# Patient Record
Sex: Male | Born: 1955
Health system: Southern US, Community
[De-identification: ages and names within clinical notes are randomized; demographics above are authoritative.]

## PROBLEM LIST (undated history)

## (undated) DIAGNOSIS — M1990 Unspecified osteoarthritis, unspecified site: Secondary | ICD-10-CM

## (undated) DIAGNOSIS — M109 Gout, unspecified: Secondary | ICD-10-CM

## (undated) HISTORY — DX: Unspecified osteoarthritis, unspecified site: M19.90

## (undated) HISTORY — DX: Gout, unspecified: M10.9

---

## 2002-10-19 ENCOUNTER — Emergency Department (HOSPITAL_COMMUNITY): Admission: EM | Admit: 2002-10-19 | Discharge: 2002-10-19 | Payer: Self-pay | Admitting: Emergency Medicine

## 2002-10-19 ENCOUNTER — Encounter: Payer: Self-pay | Admitting: Emergency Medicine

## 2002-12-21 ENCOUNTER — Encounter: Payer: Self-pay | Admitting: *Deleted

## 2002-12-21 ENCOUNTER — Encounter: Admission: RE | Admit: 2002-12-21 | Discharge: 2002-12-21 | Payer: Self-pay | Admitting: *Deleted

## 2003-01-06 ENCOUNTER — Encounter: Payer: Self-pay | Admitting: *Deleted

## 2003-01-06 ENCOUNTER — Encounter: Admission: RE | Admit: 2003-01-06 | Discharge: 2003-01-06 | Payer: Self-pay | Admitting: *Deleted

## 2006-04-05 ENCOUNTER — Emergency Department (HOSPITAL_COMMUNITY): Admission: EM | Admit: 2006-04-05 | Discharge: 2006-04-06 | Payer: Self-pay | Admitting: Emergency Medicine

## 2006-11-24 ENCOUNTER — Ambulatory Visit (HOSPITAL_BASED_OUTPATIENT_CLINIC_OR_DEPARTMENT_OTHER): Admission: RE | Admit: 2006-11-24 | Discharge: 2006-11-24 | Payer: Self-pay | Admitting: Orthopedic Surgery

## 2007-08-27 ENCOUNTER — Ambulatory Visit (HOSPITAL_COMMUNITY): Admission: RE | Admit: 2007-08-27 | Discharge: 2007-08-27 | Payer: Self-pay | Admitting: Orthopedic Surgery

## 2007-10-09 ENCOUNTER — Ambulatory Visit (HOSPITAL_COMMUNITY): Admission: RE | Admit: 2007-10-09 | Discharge: 2007-10-09 | Payer: Self-pay | Admitting: Gastroenterology

## 2009-10-23 ENCOUNTER — Encounter: Admission: RE | Admit: 2009-10-23 | Discharge: 2009-10-23 | Payer: Self-pay | Admitting: Internal Medicine

## 2011-02-01 NOTE — Op Note (Signed)
NAMEJASMINE, MACEACHERN             ACCOUNT NO.:  [id number]   MEDICAL RECORD NO.:  [id number]          PATIENT TYPE:  AMB   LOCATION:  DSC                          FACILITY:  [facility]   PHYSICIAN:  Feliberto Gottron. Turner Daniels, M.D.   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   PROCEDURE:  Right knee arthroscopic partial medial and lateral  meniscectomies, debridement chondromalacia from the apex of the patella.  The trochlea grade 3 with flap tears medial lateral femoral condyle  grade 3 with flap tears and removal of cartilaginous loose bodies.   SURGEON:  Feliberto Gottron. Turner Daniels, M.D.   ASSISTANTLaural Benes. Jannet Mantis.   ANESTHETIC:  General LMA.   ESTIMATED BLOOD LOSS:  Minimal.   FLUID REPLACEMENT:  7 mL crystalloid.   DRAINS PLACED:  None.   TOURNIQUET TIME:  None.   INDICATIONS FOR PROCEDURE:  This is a 55 year old man who is followed  for many months with chronic catching, popping and pain in both knees.  Plain radiographs were consistent with CPPD disease or calcium  pyrophosphate deposition in the articular and meniscal cartilages.  At  one point, he wanted both knees scoped after failing conservative  treatment with cortisone injections, anti-inflammatory medicines and  physical therapy.  Now he just wants to have one knee done, and then  wait and do the other one later.  Risks and benefits of surgery have  been discussed, questions answered.  He is very tender along the medial  joint line and desires elective relief of his pain.   DESCRIPTION OF PROCEDURE:  The patient identified by armband, taken to  the operating room at [facility].  Appropriate anesthetic  monitors were attached and general LMA anesthesia induced with  the  patient in the supine position.  Lateral post applied to the table and  the right lower extremity prepped and draped in the usual sterile  fashion from the ankle to the mid thigh using a #11 blade.  Standard  inferomedial and inferolateral peripatellar portals were then made  allowing introduction of the arthroscope through the inferolateral  portal and the outflow through the inferomedial portal.  Diagnostic  arthroscopy revealed grade 3 chondromalacia with flap tears and calcium  deposition to the apex of the patella.  The trochlea, medial and lateral  femoral condyles, and less so to the tibial plateaus, all these areas  were debrided back to stable margins with a 3.5 Gator sucker shaver, as  were fairly impressive radial and complex transverse tears through the  medial and lateral menisci.  The gutters were cleared medially and  laterally.  Small articular cartilage loose bodies were also removed  with a sucker shaver.  The cruciate  ligaments were probed and found to be intact.  The knee was irrigated  out in normal saline solution.  The arthroscopic instruments  were  removed.  A dressing of Xeroform, 4x4 dressing sponges, Webril and an  Ace wrap applied.  The patient was undraped, awakened and taken to  recovery room without difficulty.      Feliberto Gottron. Turner Daniels, M.D.  Electronically Signed     FJR/MEDQ  D:  11/24/2006  T:  11/24/2006  Job:  [id number]

## 2011-04-04 ENCOUNTER — Emergency Department (HOSPITAL_COMMUNITY): Payer: 59

## 2011-04-04 ENCOUNTER — Emergency Department (HOSPITAL_COMMUNITY)
Admission: EM | Admit: 2011-04-04 | Discharge: 2011-04-04 | Disposition: A | Discharge status: Home / Self Care | Payer: 59 | Attending: Emergency Medicine | Admitting: Emergency Medicine

## 2011-04-04 DIAGNOSIS — M1710 Unilateral primary osteoarthritis, unspecified knee: Secondary | ICD-10-CM | POA: Insufficient documentation

## 2011-04-04 DIAGNOSIS — Z79899 Other long term (current) drug therapy: Secondary | ICD-10-CM | POA: Insufficient documentation

## 2011-04-04 DIAGNOSIS — Z862 Personal history of diseases of the blood and blood-forming organs and certain disorders involving the immune mechanism: Secondary | ICD-10-CM | POA: Insufficient documentation

## 2011-04-04 DIAGNOSIS — M25469 Effusion, unspecified knee: Secondary | ICD-10-CM | POA: Insufficient documentation

## 2011-04-04 DIAGNOSIS — Z8639 Personal history of other endocrine, nutritional and metabolic disease: Secondary | ICD-10-CM | POA: Insufficient documentation

## 2011-04-04 DIAGNOSIS — M25569 Pain in unspecified knee: Secondary | ICD-10-CM | POA: Insufficient documentation

## 2012-09-02 ENCOUNTER — Ambulatory Visit (INDEPENDENT_AMBULATORY_CARE_PROVIDER_SITE_OTHER): Payer: Commercial Managed Care - PPO | Admitting: Emergency Medicine

## 2012-09-02 VITALS — BP 112/78 | HR 74 | Temp 98.3°F | Resp 16 | Ht 67.75 in | Wt 193.6 lb

## 2012-09-02 DIAGNOSIS — A5903 Trichomonal cystitis and urethritis: Secondary | ICD-10-CM

## 2012-09-02 LAB — POCT URINALYSIS DIPSTICK
Bilirubin, UA: NEGATIVE
Blood, UA: NEGATIVE
Leukocytes, UA: NEGATIVE
Nitrite, UA: NEGATIVE
Protein, UA: NEGATIVE
pH, UA: 5.5

## 2012-09-02 LAB — POCT UA - MICROSCOPIC ONLY: Bacteria, U Microscopic: NEGATIVE

## 2012-09-02 MED ORDER — METRONIDAZOLE 500 MG PO TABS: 500.0000 mg | ORAL_TABLET | Freq: Two times a day (BID) | ORAL | Status: DC

## 2012-09-02 NOTE — Patient Instructions (Signed)
Trichomoniasis  Trichomoniasis is an infection, caused by the Trichomonas organism, that affects both women and men. In women, the outer male genitalia and the vagina are affected. In men, the penis is mainly affected, but the prostate and other reproductive organs can also be involved. Trichomoniasis is a sexually transmitted disease (STD) and is most often passed to another person through sexual contact. The majority of people who get trichomoniasis, do so from a sexual encounter and are also at risk for other STDs, including gonorrhea and chlamydia, hepatitis B, and HIV. Trichomoniasis can increase the risk for HIV and pregnancy problems.  SYMPTOMS   · Abnormal gray-green, frothy vaginal discharge in women.  · Itching and irritation of the area outside the vagina in women.  · Penile discharge with or without pain in males.  · Inflammation of the urethra (urethritis), causing painful urination.  · Bleeding after sexual intercourse.  HOME CARE INSTRUCTIONS   · Take all medication prescribed by your caregiver.  · Take over-the-counter medication for itching or irritation as directed by your caregiver.  · Do not have sexual intercourse while you have the infection.  · Do not douche or wear tampons while you have the infection.  · Discuss the infection with your partner, as your partner may have acquired the infection from you. Or, your partner may have been the person who transmitted the infection to you.  · Have your sex partner examined and treated if necessary.  · Practice safe, informed, and protected sex.  · See your caregiver for other STD testing.  SEEK MEDICAL CARE IF:  · You still have symptoms after you finish the medication.  · You have an oral temperature above 102° F (38.9° C).  · You develop belly (abdominal) pain.  · You have pain when you urinate.  · You have bleeding after sexual intercourse.  · You develop a rash.  · The medication makes you sick or throw up (vomit).  Document Released: 10/10/2004  Document Revised: 11/25/2011 Document Reviewed: 03/24/2009  ExitCare® Patient Information ©2013 [employer].

## 2012-09-02 NOTE — Progress Notes (Signed)
[facility] [address], [place] [address] [phone]  Date:  09/02/2012   Name:  John Bernard   DOB:  August 01, 1956   MRN:  [id number]  PCP:  No primary provider on file.    Chief Complaint: Exposure to STD   History of Present Illness:  John Bernard is a 56 y.o. very pleasant male patient who presents with the following:  Says that his sexual partner was treated for trichomonas.  He has no discharge or dysuria, urgency, or frequency.  Treated 10-15 years ago for STD but doesn't remember nature of infection.  There is no problem list on file for this patient.   Past Medical History  Diagnosis Date  . Gout   . Arthritis     History reviewed. No pertinent past surgical history.  History  Substance Use Topics  . Smoking status: Current Every Day Smoker  . Smokeless tobacco: Not on file  . Alcohol Use: Not on file    Family History  Problem Relation Age of Onset  . Cancer Mother   . Cancer Father     No Known Allergies  Medication list has been reviewed and updated.  No current outpatient prescriptions on file prior to visit.    Review of Systems:  As per HPI, otherwise negative.    Physical Examination: Filed Vitals:   09/02/12 1243  BP: 112/78  Pulse: 74  Temp: 98.3 F (36.8 C)  Resp: 16   Filed Vitals:   09/02/12 1243  Height: 5' 7.75" (1.721 m)  Weight: 193 lb 9.6 oz (87.816 kg)   Body mass index is 29.65 kg/(m^2). Ideal Body Weight: Weight in (lb) to have BMI = 25: 162.9    GEN: WDWN, NAD, Non-toxic, Alert & Oriented x 3 HEENT: Atraumatic, Normocephalic.  Ears and Nose: No external deformity. EXTR: No clubbing/cyanosis/edema NEURO: Normal gait.  PSYCH: Normally interactive. Conversant. Not depressed or anxious appearing.  Calm demeanor.  Genitalia: normal male no discharge  Assessment and Plan: Exposure to trichomonas Flagyl  Labs    UA    Glenford Peers probe  [name], MD  Results for  orders placed in visit on 09/02/12  POCT UA - MICROSCOPIC ONLY      Component Value Range   WBC, Ur, HPF, POC 0-1     RBC, urine, microscopic 0-1     Bacteria, U Microscopic neg     Mucus, UA neg     Epithelial cells, urine per micros 0-2     Crystals, Ur, HPF, POC neg     Casts, Ur, LPF, POC neg     Yeast, UA neg    POCT URINALYSIS DIPSTICK      Component Value Range   Color, UA yellow     Clarity, UA clear     Glucose, UA neg     Bilirubin, UA neg     Ketones, UA neg     Spec Grav, UA >=1.030     Blood, UA neg     pH, UA 5.5     Protein, UA neg     Urobilinogen, UA 0.2     Nitrite, UA neg     Leukocytes, UA Negative

## 2012-09-11 NOTE — Progress Notes (Signed)
Reviewed and agree.

## 2015-10-13 MED FILL — DENTA 5000 PLUS CREAM: 1.1 | 25 days supply | Qty: 51 | Fill #0

## 2015-10-17 MED FILL — ALLOPURINOL 300 MG TABLET: 300 | 30 days supply | Qty: 30 | Fill #0

## 2015-10-17 MED FILL — COLCHICINE 0.6 MG TABLET: 0.6 | 30 days supply | Qty: 30 | Fill #0

## 2015-10-31 ENCOUNTER — Encounter (HOSPITAL_COMMUNITY): Payer: Self-pay

## 2015-10-31 ENCOUNTER — Emergency Department (HOSPITAL_COMMUNITY)
Admission: EM | Admit: 2015-10-31 | Discharge: 2015-10-31 | Disposition: A | Discharge status: Home / Self Care | Payer: 59 | Attending: Emergency Medicine | Admitting: Emergency Medicine

## 2015-10-31 ENCOUNTER — Emergency Department (HOSPITAL_COMMUNITY): Payer: 59

## 2015-10-31 DIAGNOSIS — F17200 Nicotine dependence, unspecified, uncomplicated: Secondary | ICD-10-CM | POA: Insufficient documentation

## 2015-10-31 DIAGNOSIS — S6992XA Unspecified injury of left wrist, hand and finger(s), initial encounter: Secondary | ICD-10-CM | POA: Diagnosis present

## 2015-10-31 DIAGNOSIS — S8002XA Contusion of left knee, initial encounter: Secondary | ICD-10-CM | POA: Insufficient documentation

## 2015-10-31 DIAGNOSIS — Y92410 Unspecified street and highway as the place of occurrence of the external cause: Secondary | ICD-10-CM | POA: Insufficient documentation

## 2015-10-31 DIAGNOSIS — S79912A Unspecified injury of left hip, initial encounter: Secondary | ICD-10-CM | POA: Insufficient documentation

## 2015-10-31 DIAGNOSIS — Z792 Long term (current) use of antibiotics: Secondary | ICD-10-CM | POA: Insufficient documentation

## 2015-10-31 DIAGNOSIS — Y998 Other external cause status: Secondary | ICD-10-CM | POA: Insufficient documentation

## 2015-10-31 DIAGNOSIS — M25552 Pain in left hip: Secondary | ICD-10-CM

## 2015-10-31 DIAGNOSIS — Y9389 Activity, other specified: Secondary | ICD-10-CM | POA: Insufficient documentation

## 2015-10-31 DIAGNOSIS — Z8739 Personal history of other diseases of the musculoskeletal system and connective tissue: Secondary | ICD-10-CM | POA: Insufficient documentation

## 2015-10-31 DIAGNOSIS — S60222A Contusion of left hand, initial encounter: Secondary | ICD-10-CM | POA: Diagnosis not present

## 2015-10-31 MED ORDER — NAPROXEN 500 MG PO TABS: 500.0000 mg | ORAL_TABLET | Freq: Two times a day (BID) | ORAL | Status: DC

## 2015-10-31 MED ORDER — HYDROCODONE-ACETAMINOPHEN 5-325 MG PO TABS
1.0000 | ORAL_TABLET | Freq: Once | ORAL | Status: AC
  Administered 2015-10-31: 1 via ORAL
  Filled 2015-10-31: qty 1

## 2015-10-31 MED ORDER — HYDROCODONE-ACETAMINOPHEN 5-325 MG PO TABS: 1.0000 | ORAL_TABLET | Freq: Four times a day (QID) | ORAL | Status: DC | PRN

## 2015-10-31 MED ORDER — ONDANSETRON 4 MG PO TBDP
4.0000 mg | ORAL_TABLET | Freq: Once | ORAL | Status: AC
  Administered 2015-10-31: 4 mg via ORAL
  Filled 2015-10-31: qty 1

## 2015-10-31 NOTE — Discharge Instructions (Signed)
Your x-rays showed no evidence of fracture or dislocation. I will give you prescriptions for Vicodin and naproxen.  Take medications as prescribed. Return to the emergency room for worsening condition or new concerning symptoms. Follow up with your regular doctor. If you don't have a regular doctor use one of the numbers below to establish a primary care doctor.   Emergency Department Resource Guide 1) Find a Doctor and Pay Out of Pocket Although you won't have to find out who is covered by your insurance plan, it is a good idea to ask around and get recommendations. You will then need to call the office and see if the doctor you have chosen will accept you as a new patient and what types of options they offer for patients who are self-pay. Some doctors offer discounts or will set up payment plans for their patients who do not have insurance, but you will need to ask so you aren't surprised when you get to your appointment.  2) Contact Your Local Health Department Not all health departments have doctors that can see patients for sick visits, but many do, so it is worth a call to see if yours does. If you don't know where your local health department is, you can check in your phone book. The CDC also has a tool to help you locate your state's health department, and many state websites also have listings of all of their local health departments.  3) Find a Walk-in Clinic If your illness is not likely to be very severe or complicated, you may want to try a walk in clinic. These are popping up all over the country in pharmacies, drugstores, and shopping centers. They're usually staffed by nurse practitioners or physician assistants that have been trained to treat common illnesses and complaints. They're usually fairly quick and inexpensive. However, if you have serious medical issues or chronic medical problems, these are probably not your best option.  No Primary Care Doctor: - Call Health Connect at   [phone] - they can help you locate a primary care doctor that  accepts your insurance, provides certain services, etc. - Physician Referral [phone]  Emergency Department Resource Guide 1) Find a Doctor and Pay Out of Pocket Although you won't have to find out who is covered by your insurance plan, it is a good idea to ask around and get recommendations. You will then need to call the office and see if the doctor you have chosen will accept you as a new patient and what types of options they offer for patients who are self-pay. Some doctors offer discounts or will set up payment plans for their patients who do not have insurance, but you will need to ask so you aren't surprised when you get to your appointment.  2) Contact Your Local Health Department Not all health departments have doctors that can see patients for sick visits, but many do, so it is worth a call to see if yours does. If you don't know where your local health department is, you can check in your phone book. The CDC also has a tool to help you locate your state's health department, and many state websites also have listings of all of their local health departments.  3) Find a Walk-in Clinic If your illness is not likely to be very severe or complicated, you may want to try a walk in clinic. These are popping up all over the country in pharmacies, drugstores, and shopping centers. They're usually staffed  by nurse practitioners or physician assistants that have been trained to treat common illnesses and complaints. They're usually fairly quick and inexpensive. However, if you have serious medical issues or chronic medical problems, these are probably not your best option.  No Primary Care Doctor: - Call Health Connect at  [phone] - they can help you locate a primary care doctor that  accepts your insurance, provides certain services, etc. - Physician Referral Service- [phone]  Chronic Pain  Problems: Organization         Address  Phone   Notes  [facility]  [phone] Patients need to be referred by their primary care doctor.   Medication Assistance: Organization         Address  Phone   Notes  [facility] [address] [address] [facility] [address] [place], [place] [address] [phone] --Must be a resident of [facility] [place] [facility] -- Must have NO insurance coverage whatsoever (no Medicaid/ Medicare, etc.) -- The pt. MUST have a primary care doctor that directs their care regularly and follows them in the community   Med[facility]ist  [phone]   [employer]  [phone]    Agencies that provide inexpensive medical care: Organization         Address  Phone   Notes  [facility]  [phone]   [facility]    [phone]   [facility] [address] [place], [place] [address] [phone]   [facility] [address], [place] [phone]   [facility]    [phone]   [facility]    [phone]   [facility]  [address], [place] Phone:  [phone], Fax:  [phone] Hours of Operation:  9 am - 6 pm, M-F.  Also accepts Medicaid/Medicare and self-pay.  [facility]  [address], [place] Phone: [phone], Fax: [phone]. Hours of Operation:  8:30 am - 5:30 pm, M-F.  Also accepts Medicaid and self-pay.  [facility] [place] [address], [place] Phone: [phone]   [facility] [address], [place], [place] [phone], Ext. [phone] Mondays & Thursdays: 7-9 AM.  First 15 patients are seen on a first come, first serve basis.    Medicaid-accepting [facility] Providers:  Organization         Address  Phone   Notes  [facility] [address], [place] [phone] Also  accepts self-pay patients.  [facility] [address], [place]  [phone]   [facility] [address], [place] [phone]   [facility] [address] [place] [address], [place] [phone]   Renaye RakersVeita Bland [address], [place]   [phone] Only accepts [place] patients after they have their name applied to their card.   Self-Pay (no insurance) in [facility] [place]:  Organization         Address  Phone   Notes  Sickle Cell Patients, [facility] [address], [place] [phone]   [facility] [address], [place] [phone]   [facility] [place]  [address], [place] [phone]   [facility]/Dr.  Osei-Bonsu  [address] [place] or [address], [place] [phone] Phone number for both [place] and [place] locations is the same.  [facility] [address], [place] [phone]   [facility] [address] [place] or [address] [phone]   [facility] [address] [place] [phone], phone; [phone], fax Sees patients 1st and 3rd Saturday of every month.  Must not qualify for public or private insurance (i.e. Medicaid, Medicare, [place] Health Choice, Veterans' Benefits)  Household income should be no more than 200% of the poverty level The clinic cannot treat you if you are pregnant or think you are pregnant  Sexually transmitted diseases are not treated at the clinic.     Contusion A contusion is a deep bruise. Contusions are the result of a blunt injury to tissues and muscle fibers under the skin. The injury causes bleeding under the skin. The skin overlying the contusion may turn blue, purple, or yellow. Minor injuries will  give you a painless contusion, but more severe contusions may stay painful and swollen for a few weeks.  CAUSES  This condition is usually caused by a blow, trauma, or direct force to an area of the body. SYMPTOMS  Symptoms of this condition include:  Swelling of the injured area.  Pain and tenderness in the injured area.  Discoloration. The area may have redness and then turn blue, purple, or yellow. DIAGNOSIS  This condition is diagnosed based on a physical exam and medical history. An X-ray, CT scan, or MRI may be needed to determine if there are any associated injuries, such as broken bones (fractures). TREATMENT  Specific treatment for this condition depends on what area of the body was injured. In general, the best treatment for a contusion is resting, icing, applying pressure to (compression), and elevating the injured area. This is often called the RICE strategy. Over-the-counter anti-inflammatory medicines may also be recommended for pain control.  HOME CARE INSTRUCTIONS   Rest the injured area.  If directed, apply ice to the injured area:  Put ice in a plastic bag.  Place a towel between your skin and the bag.  Leave the ice on for 20 minutes, 2-3 times per day.  If directed, apply light compression to the injured area using an elastic bandage. Make sure the bandage is not wrapped too tightly. Remove and reapply the bandage as directed by your health care provider.  If possible, raise (elevate) the injured area above the level of your heart while you are sitting or lying down.  Take over-the-counter and prescription medicines only as told by your health care provider. SEEK MEDICAL CARE IF:  Your symptoms do not improve after several days of treatment.  Your symptoms get worse.  You have difficulty moving the injured area. SEEK IMMEDIATE MEDICAL CARE IF:   You have severe pain.  You have numbness in a hand or foot.  Your hand or foot turns pale or cold.   This  information is not intended to replace advice given to you by your health care provider. Make sure you discuss any questions you have with your health care provider.   Document Released: 06/12/2005 Document Revised: 05/24/2015 Document Reviewed: 01/18/2015 Elsevier Interactive Patient Education [employer]
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
## 2015-11-01 MED FILL — NAPROXEN 500 MG TABLET: 500 | 15 days supply | Qty: 30 | Fill #0

## 2015-11-01 MED FILL — HYDROCODON-APAP 5-325: 5-325 | 3 days supply | Qty: 10 | Fill #0

## 2015-12-27 ENCOUNTER — Other Ambulatory Visit: Payer: Self-pay | Admitting: Internal Medicine

## 2015-12-27 DIAGNOSIS — R109 Unspecified abdominal pain: Secondary | ICD-10-CM

## 2016-01-22 ENCOUNTER — Ambulatory Visit
Admission: RE | Admit: 2016-01-22 | Discharge: 2016-01-22 | Disposition: A | Discharge status: Home / Self Care | Payer: BLUE CROSS/BLUE SHIELD | Source: Ambulatory Visit | Attending: Internal Medicine | Admitting: Internal Medicine

## 2016-01-22 DIAGNOSIS — R109 Unspecified abdominal pain: Secondary | ICD-10-CM

## 2016-02-15 MED FILL — COLCRYS 0.6 MG TABLET: 0.6 | 30 days supply | Qty: 30 | Fill #0

## 2016-05-22 MED FILL — MELOXICAM 7.5 MG TABLET: 7.5 | 30 days supply | Qty: 30 | Fill #0

## 2016-05-22 MED FILL — predniSONE 10 MG TABS: 10 | 6 days supply | Qty: 21 | Fill #0

## 2016-06-12 MED FILL — COLCRYS 0.6 MG TABLET: 0.6 | 90 days supply | Qty: 90 | Fill #0

## 2016-06-12 MED FILL — ULORIC 40 MG TABLET: 40 | 90 days supply | Qty: 90 | Fill #0
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
## 2017-03-22 IMAGING — US US ABDOMEN COMPLETE
1 series · 14 of 25 positions shown · non-contrast
Comparison: None.

CLINICAL DATA: Abdominal pain for 9 months.

EXAM:
ABDOMEN ULTRASOUND COMPLETE

[Series 1: us abdomen complete · 0.26mm/px · 14 of 67 slices shown]
[im 1/67]
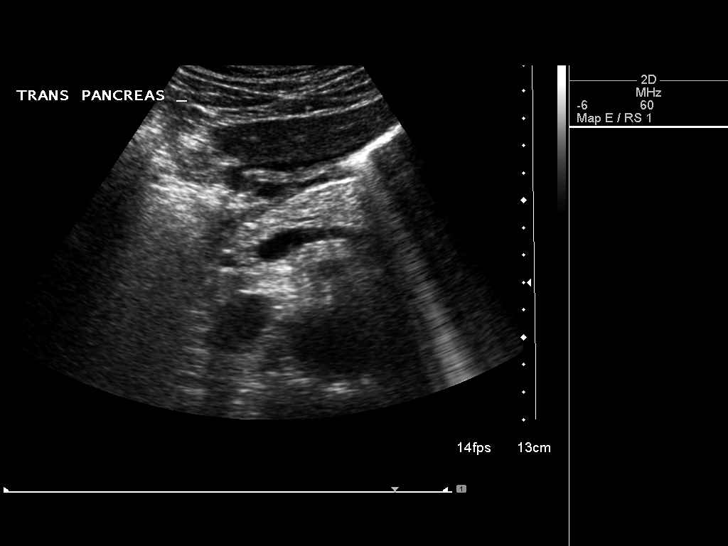
[im 6/67]
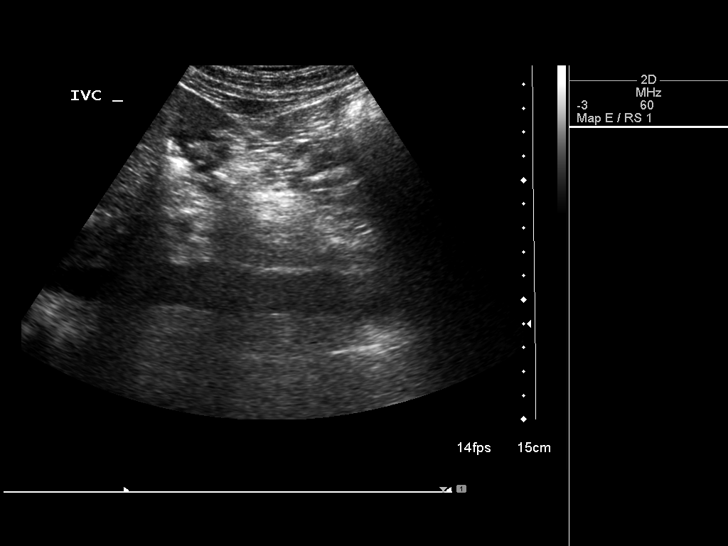
[im 12/67]
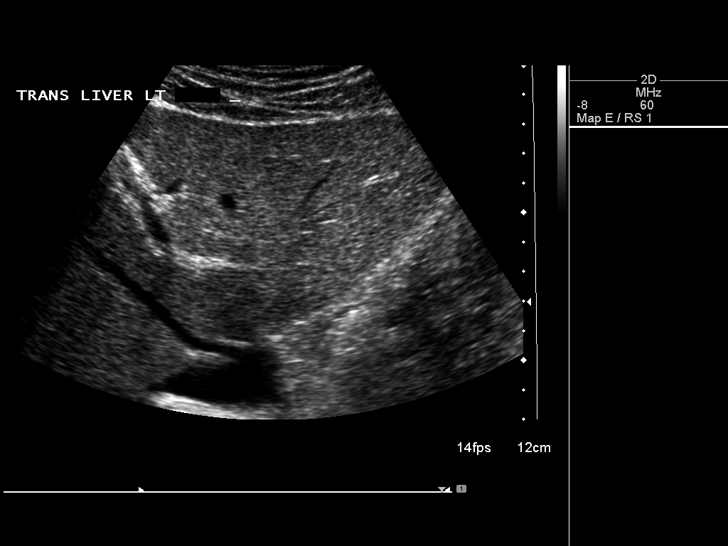
[im 17/67]
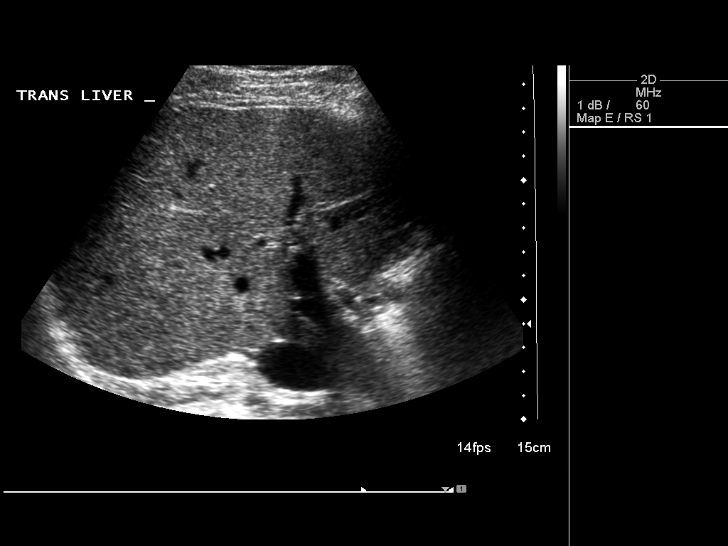
[im 23/67]
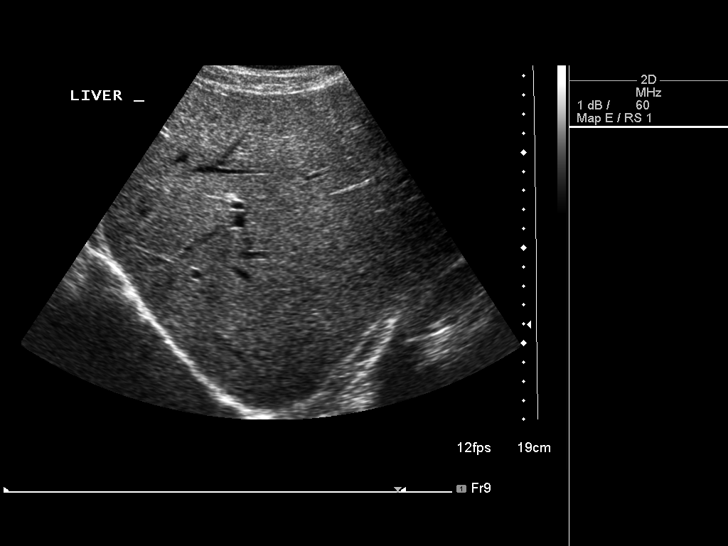
[im 25/67]
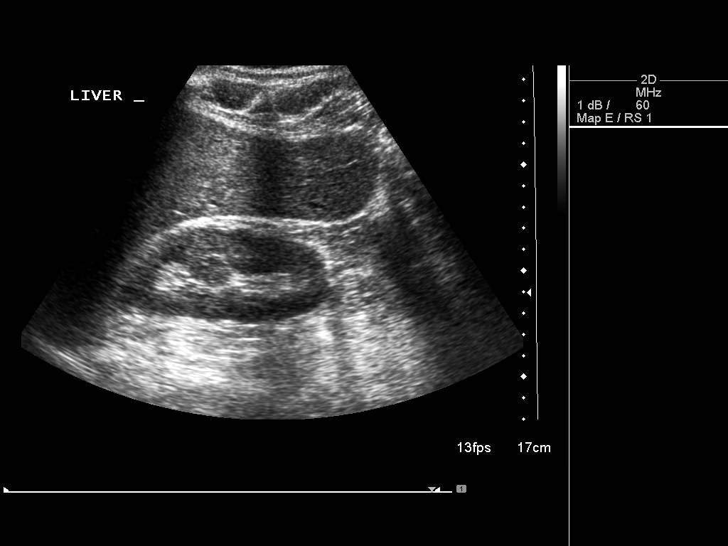
[im 31/67]
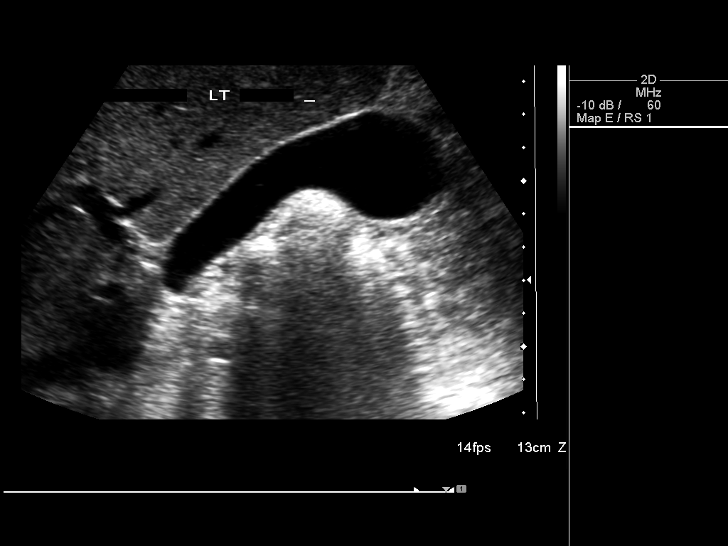
[im 36/67]
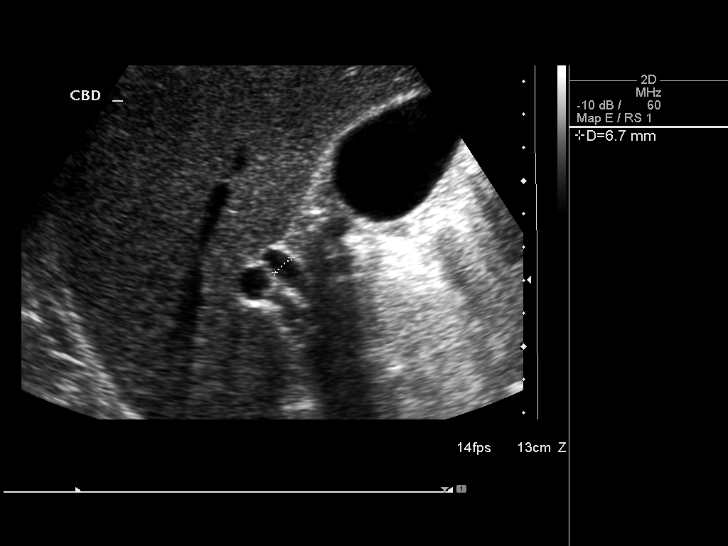
[im 42/67]
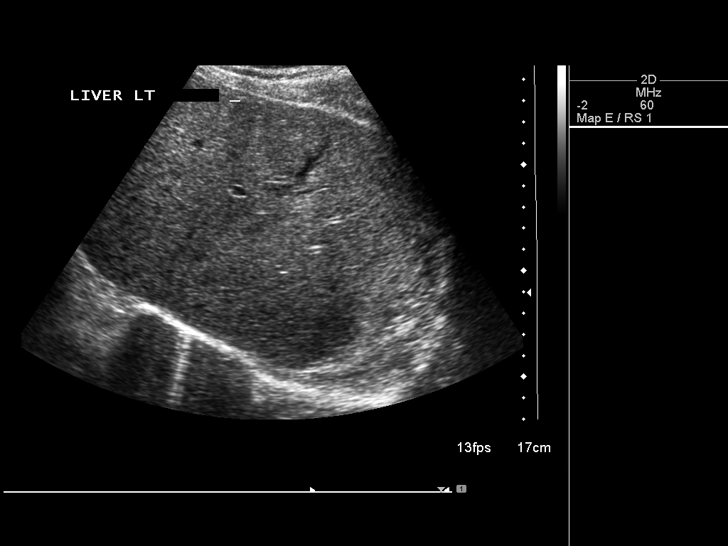
[im 45/67]
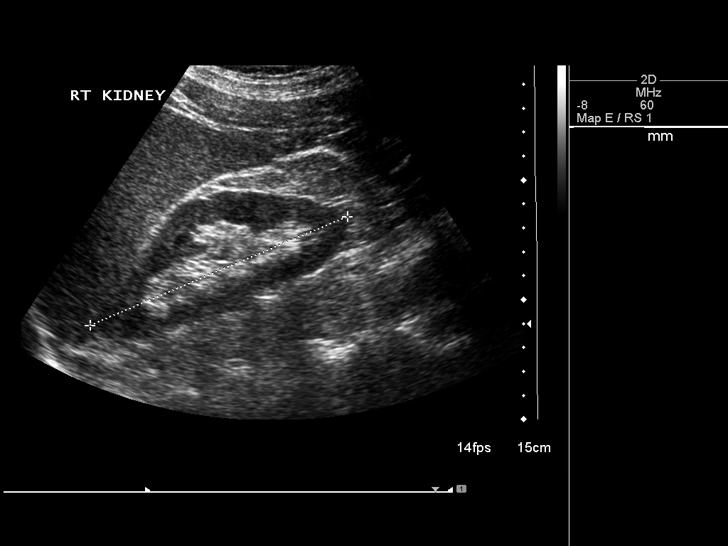
[im 50/67]
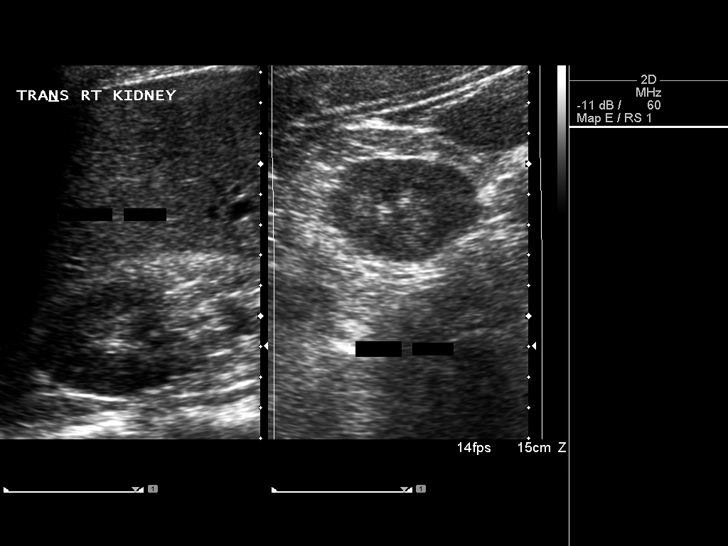
[im 56/67]
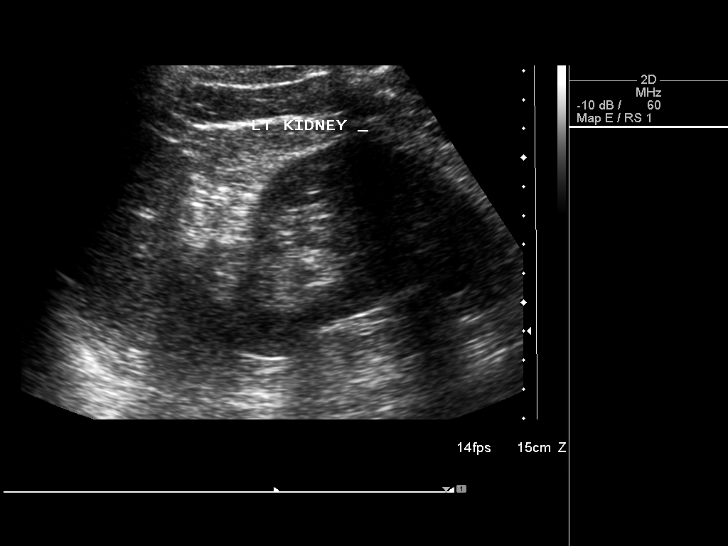
[im 61/67]
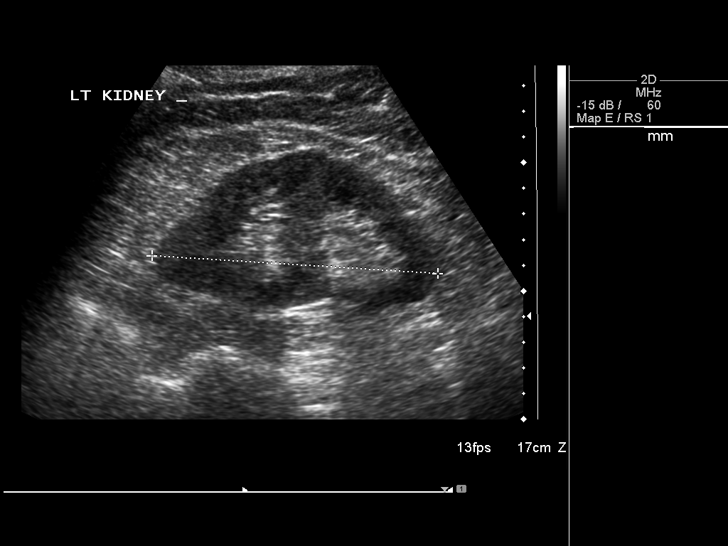
[im 67/67]
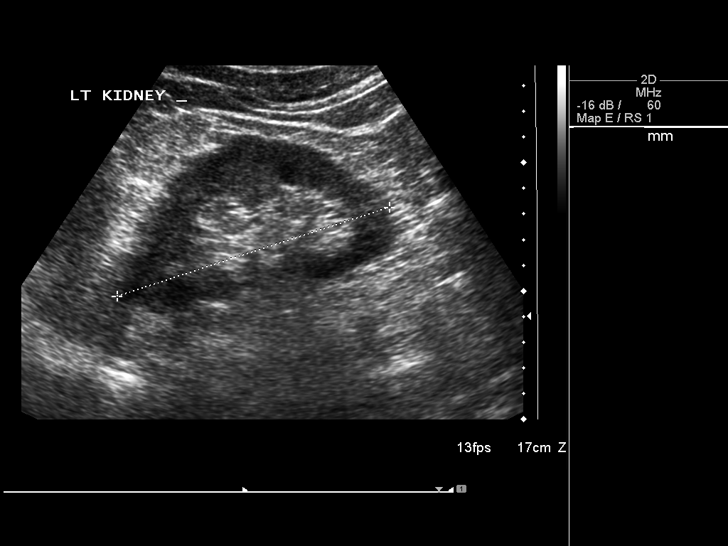

[14 of 25 positions shown; findings below may reference images not displayed]

FINDINGS: Gallbladder: No gallstones or wall thickening visualized. No
sonographic Murphy sign noted by sonographer.

Common bile duct: Diameter: 6.7 mm. This is slightly prominent.
However, no visible common bile duct stone. No intrahepatic ductal
dilatation.

Liver: No focal lesion identified. Within normal limits in
parenchymal echogenicity.

IVC: No abnormality visualized.

Pancreas: Visualized portion unremarkable.

Spleen: 4.4 cm in length.  Normal.

Right Kidney: Length: 11.7 cm. Echogenicity within normal limits. No
mass or hydronephrosis visualized.

Left Kidney: Length: 11.2 cm. Echogenicity within normal limits. No
mass or hydronephrosis visualized.

Abdominal aorta: 2.3 cm maximal diameter.  No aneurysm.

Other findings: None.
IMPRESSION: No significant abnormality. Slight prominence of the common bile
duct is nonspecific. Is the patient's bilirubin normal?

## 2017-04-07 MED FILL — COLCRYS 0.6 MG TABLET: 0.6 | 87 days supply | Qty: 90 | Fill #0

## 2017-04-07 MED FILL — predniSONE 10 MG TABS: 10 | 6 days supply | Qty: 21 | Fill #0

## 2017-04-07 MED FILL — MELOXICAM 7.5 MG TABLET: 7.5 | 90 days supply | Qty: 90 | Fill #0

## 2018-03-27 MED FILL — ALLOPURINOL 100 MG TABLET: 100 | 30 days supply | Qty: 30 | Fill #0

## 2018-03-27 MED FILL — predniSONE 10 MG TABS: 10 | 6 days supply | Qty: 21 | Fill #0

## 2018-05-01 MED FILL — GABAPENTIN 100 MG CAP: 100 | 30 days supply | Qty: 90 | Fill #0

## 2018-10-02 ENCOUNTER — Ambulatory Visit: Payer: Commercial Managed Care - PPO | Admitting: Nurse Practitioner

## 2019-01-04 ENCOUNTER — Ambulatory Visit
Admission: EM | Admit: 2019-01-04 | Discharge: 2019-01-04 | Disposition: A | Discharge status: Home / Self Care | Payer: BLUE CROSS/BLUE SHIELD | Attending: Family Medicine | Admitting: Family Medicine

## 2019-01-04 ENCOUNTER — Encounter: Payer: Self-pay | Admitting: Emergency Medicine

## 2019-01-04 ENCOUNTER — Other Ambulatory Visit: Payer: Self-pay

## 2019-01-04 DIAGNOSIS — Z202 Contact with and (suspected) exposure to infections with a predominantly sexual mode of transmission: Secondary | ICD-10-CM | POA: Diagnosis present

## 2019-01-04 MED ORDER — METRONIDAZOLE 500 MG PO TABS: 2000.0000 mg | ORAL_TABLET | Freq: Once | ORAL | 0 refills | Status: AC
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
## 2019-01-04 NOTE — ED Notes (Signed)
Patient able to ambulate independently  

## 2019-01-04 NOTE — Discharge Instructions (Addendum)
We are giving you the male treatment for trichomonas.  Test should be back by Wednesday and we will call you if positive.

## 2019-01-04 NOTE — ED Provider Notes (Signed)
[facility]    CSN: [id number] Arrival date & time: 01/04/19  1551     History   Chief Complaint Chief Complaint  Patient presents with  . Exposure to STD    HPI John Bernard is a 63 y.o. male.   New patient to [facility] presents with exposure to trichomonas.  Partner tested positive.  He has no sx.     Past Medical History:  Diagnosis Date  . Arthritis   . Gout     There are no active problems to display for this patient.   History reviewed. No pertinent surgical history.     Home Medications    Prior to Admission medications   Medication Sig Start Date End Date Taking? Authorizing Provider  allopurinol (ZYLOPRIM) 100 MG tablet Take 100 mg by mouth daily.   Yes [provider]  colchicine 0.6 MG tablet Take 0.6 mg by mouth daily.   Yes [provider]  metroNIDAZOLE (FLAGYL) 500 MG tablet Take 4 tablets (2,000 mg total) by mouth once for 1 dose. 01/04/19 01/04/19  Elvina SidleLauenstein, Alexah Kivett, MD    Family History Family History  Problem Relation Age of Onset  . Cancer Mother   . Cancer Father     Social History Social History   Tobacco Use  . Smoking status: Current Every Day Smoker    Packs/day: 0.25  . Smokeless tobacco: Never Used  Substance Use Topics  . Alcohol use: Yes    Comment: socially  . Drug use: Not on file     Allergies   Patient has no known allergies.   Review of Systems Review of Systems  Genitourinary: Negative.   All other systems reviewed and are negative.    Physical Exam Triage Vital Signs ED Triage Vitals [01/04/19 1603]  Enc Vitals Group     BP (!) 160/85     Pulse Rate 72     Resp 18     Temp 98.6 F (37 C)     Temp Source Oral     SpO2 95 %     Weight      Height      Head Circumference      Peak Flow      Pain Score 0     Pain Loc      Pain Edu?      Excl. in GC?    No data found.  Updated Vital Signs BP (!) 160/85 (BP Location: Right Arm)   Pulse 72   Temp 98.6 F (37  C) (Oral)   Resp 18   SpO2 95%    Physical Exam Vitals signs and nursing note reviewed.  Constitutional:      Appearance: Normal appearance.  HENT:     Mouth/Throat:     Mouth: Mucous membranes are moist.  Eyes:     Conjunctiva/sclera: Conjunctivae normal.  Neck:     Musculoskeletal: Normal range of motion and neck supple.  Cardiovascular:     Rate and Rhythm: Normal rate.  Pulmonary:     Effort: Pulmonary effort is normal.  Musculoskeletal: Normal range of motion.  Skin:    General: Skin is warm.  Neurological:     General: No focal deficit present.     Mental Status: He is alert.  Psychiatric:        Mood and Affect: Mood normal.      UC Treatments / Results  Labs (all labs ordered are listed, but only abnormal results are displayed)  Labs Reviewed  URINE CYTOLOGY ANCILLARY ONLY    EKG None  Radiology No results found.  Procedures Procedures (including critical care time)  Medications Ordered in UC Medications - No data to display  Initial Impression / Assessment and Plan / UC Course  I have reviewed the triage vital signs and the nursing notes.  Pertinent labs & imaging results that were available during my care of the patient were reviewed by me and considered in my medical decision making (see chart for details).    Final Clinical Impressions(s) / UC Diagnoses   Final diagnoses:  STD exposure     Discharge Instructions     We are giving you the male treatment for trichomonas.  Test should be back by Wednesday and we will call you if positive.    ED Prescriptions    Medication Sig Dispense Auth. Provider   metroNIDAZOLE (FLAGYL) 500 MG tablet Take 4 tablets (2,000 mg total) by mouth once for 1 dose. 4 tablet Elvina Sidle, MD     Controlled Substance Prescriptions [place] Controlled Substance Registry consulted? Not Applicable   Elvina Sidle, MD 01/04/19 1630

## 2019-01-06 LAB — URINE CYTOLOGY ANCILLARY ONLY
Chlamydia: NEGATIVE
Neisseria Gonorrhea: NEGATIVE
Trichomonas: NEGATIVE
